# Patient Record
Sex: Female | Born: 1990 | Race: Black or African American | Hispanic: No | Marital: Married | Smoking: Never smoker
Health system: Southern US, Community
[De-identification: ages and names within clinical notes are randomized; demographics above are authoritative.]

## PROBLEM LIST (undated history)

## (undated) DIAGNOSIS — R7303 Prediabetes: Secondary | ICD-10-CM

## (undated) DIAGNOSIS — E230 Hypopituitarism: Secondary | ICD-10-CM

## (undated) DIAGNOSIS — N970 Female infertility associated with anovulation: Secondary | ICD-10-CM

## (undated) DIAGNOSIS — E236 Other disorders of pituitary gland: Secondary | ICD-10-CM

## (undated) HISTORY — PX: NO PAST SURGERIES: SHX2092

## (undated) HISTORY — DX: Hypopituitarism: E23.0

## (undated) HISTORY — DX: Prediabetes: R73.03

## (undated) HISTORY — DX: Other disorders of pituitary gland: E23.6

---

## 2018-03-29 ENCOUNTER — Other Ambulatory Visit: Payer: Self-pay | Admitting: Obstetrics & Gynecology

## 2018-03-29 DIAGNOSIS — N970 Female infertility associated with anovulation: Secondary | ICD-10-CM

## 2018-04-08 ENCOUNTER — Other Ambulatory Visit: Payer: Self-pay

## 2018-04-15 ENCOUNTER — Ambulatory Visit
Admission: RE | Admit: 2018-04-15 | Discharge: 2018-04-15 | Disposition: A | Discharge status: Home / Self Care | Payer: BLUE CROSS/BLUE SHIELD | Source: Ambulatory Visit | Attending: Obstetrics & Gynecology | Admitting: Obstetrics & Gynecology

## 2018-04-15 DIAGNOSIS — N970 Female infertility associated with anovulation: Secondary | ICD-10-CM

## 2019-04-19 DIAGNOSIS — R7989 Other specified abnormal findings of blood chemistry: Secondary | ICD-10-CM | POA: Insufficient documentation

## 2019-11-13 IMAGING — RF HYSTEROSALPINGOGRAM
1 series · 7 of 7 positions shown · IV contrast (omnipaque)
Comparison: None.

CLINICAL DATA: Female infertility associated with anovulation.
Amenorrhea.

EXAM:
HYSTEROSALPINGOGRAM
TECHNIQUE: Following cleansing of the cervix and vagina with Betadine solution,
a hysterosalpingogram was performed using a 5-French
hysterosalpingogram catheter and Omnipaque 300 contrast. The patient
tolerated the examination without difficulty.

[Series 1: one shot · 0.15mm/px · 7 of 7 slices shown]
[im 1/7]
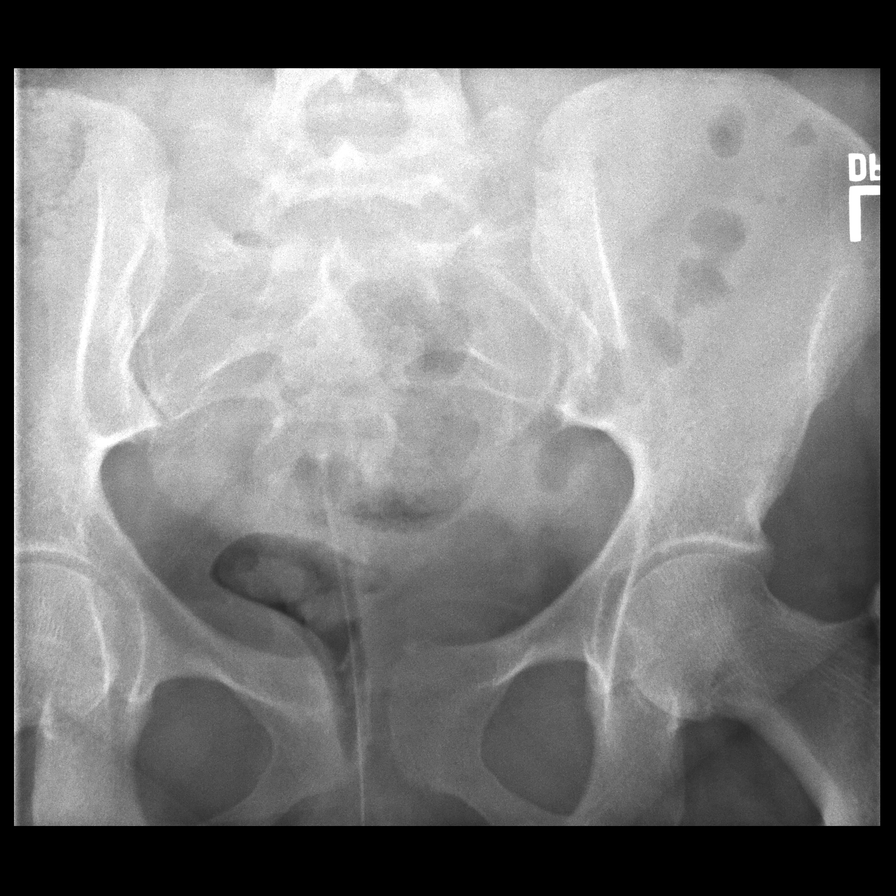
[im 2/7]
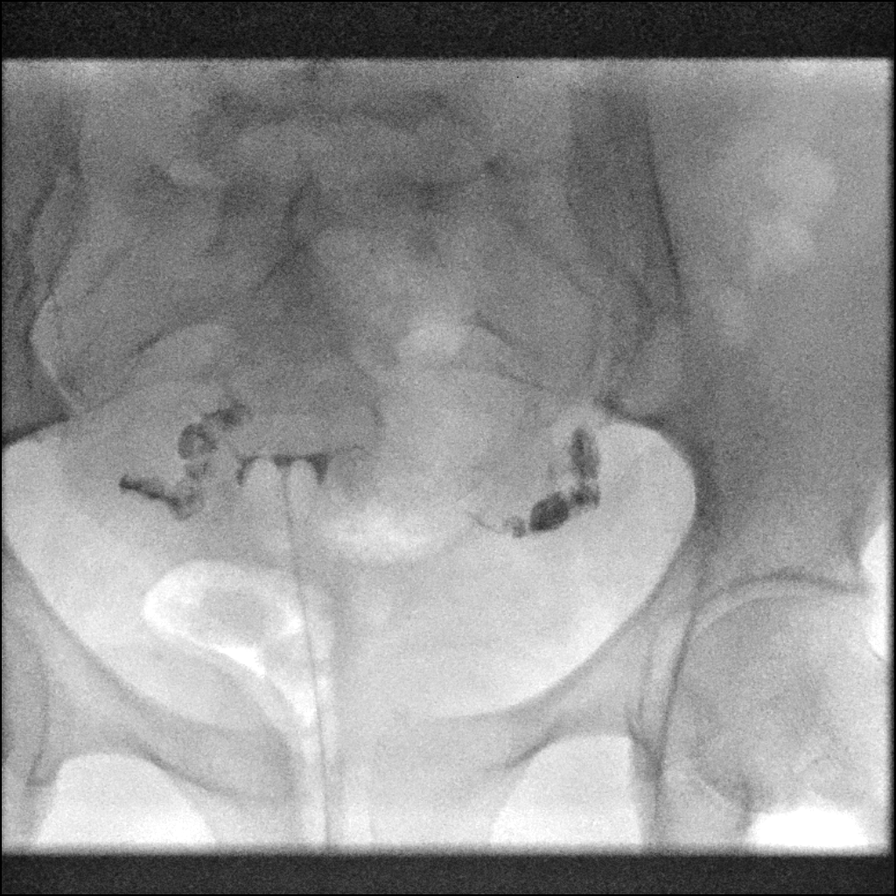
[im 3/7]
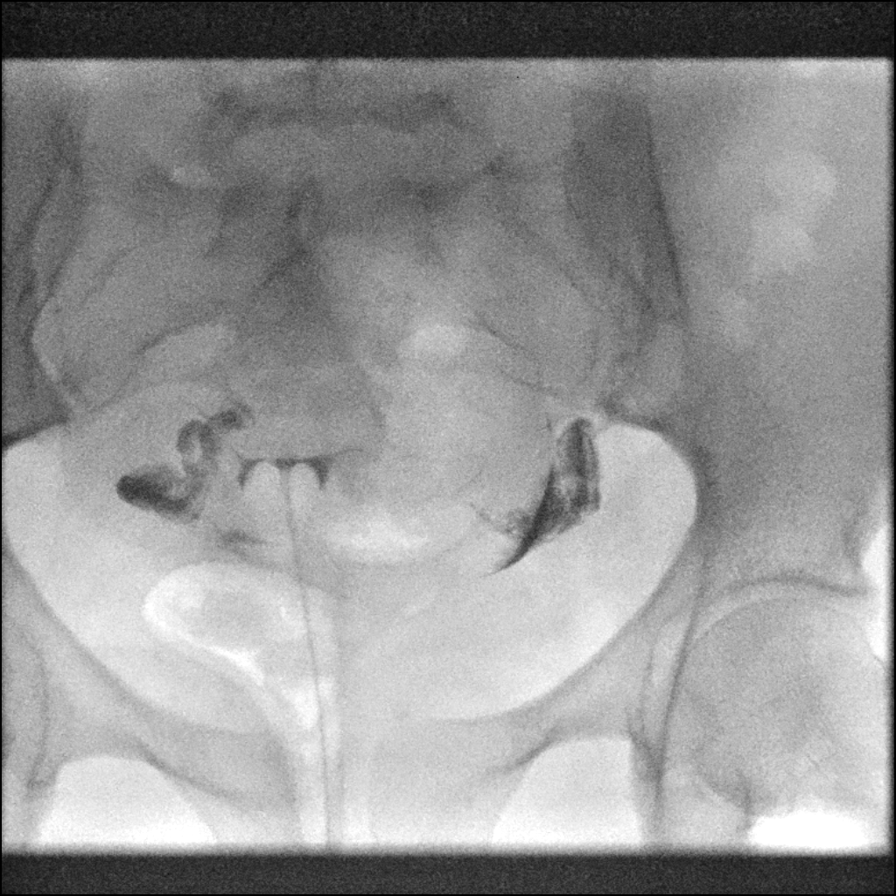
[im 4/7]
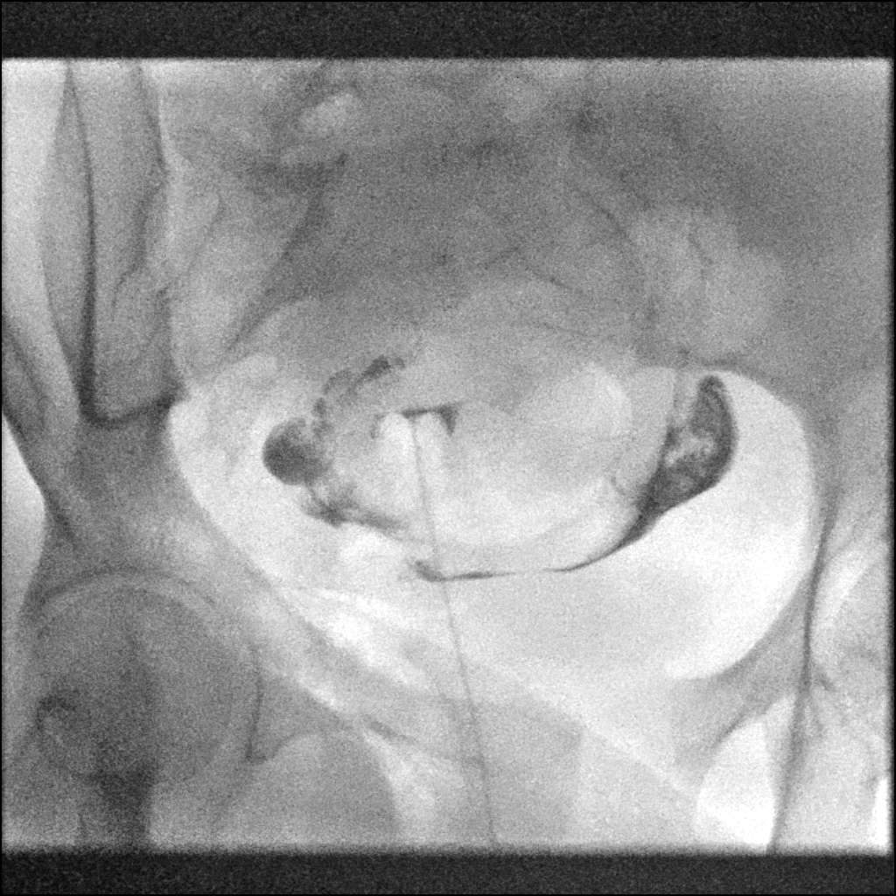
[im 5/7]
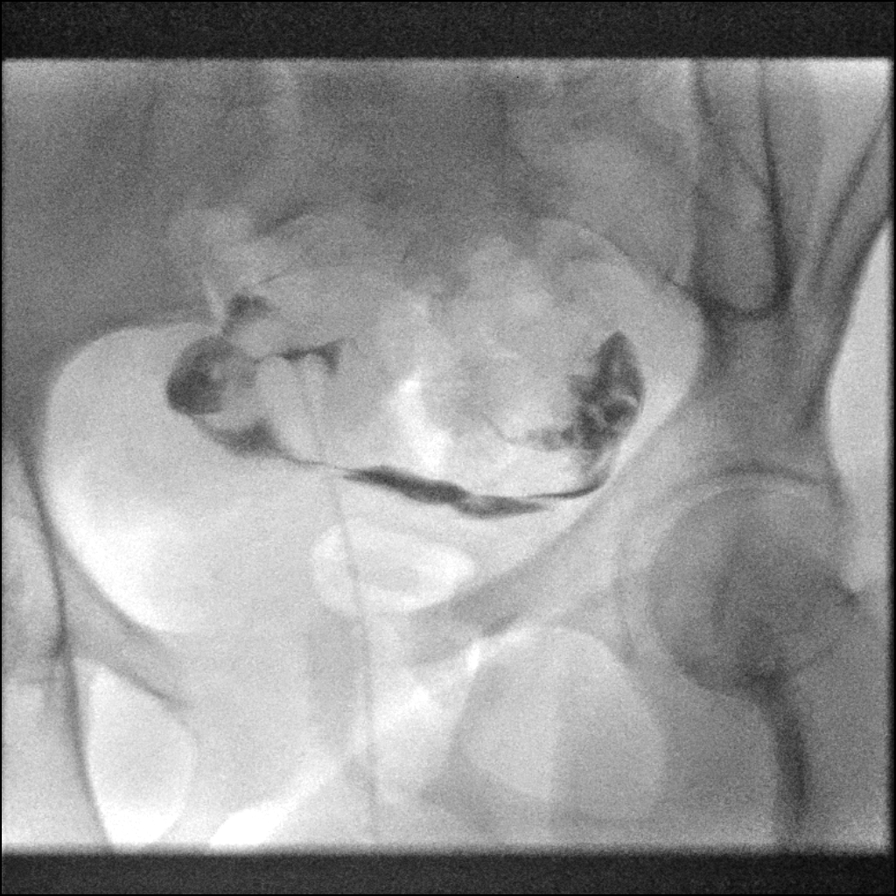
[im 6/7]
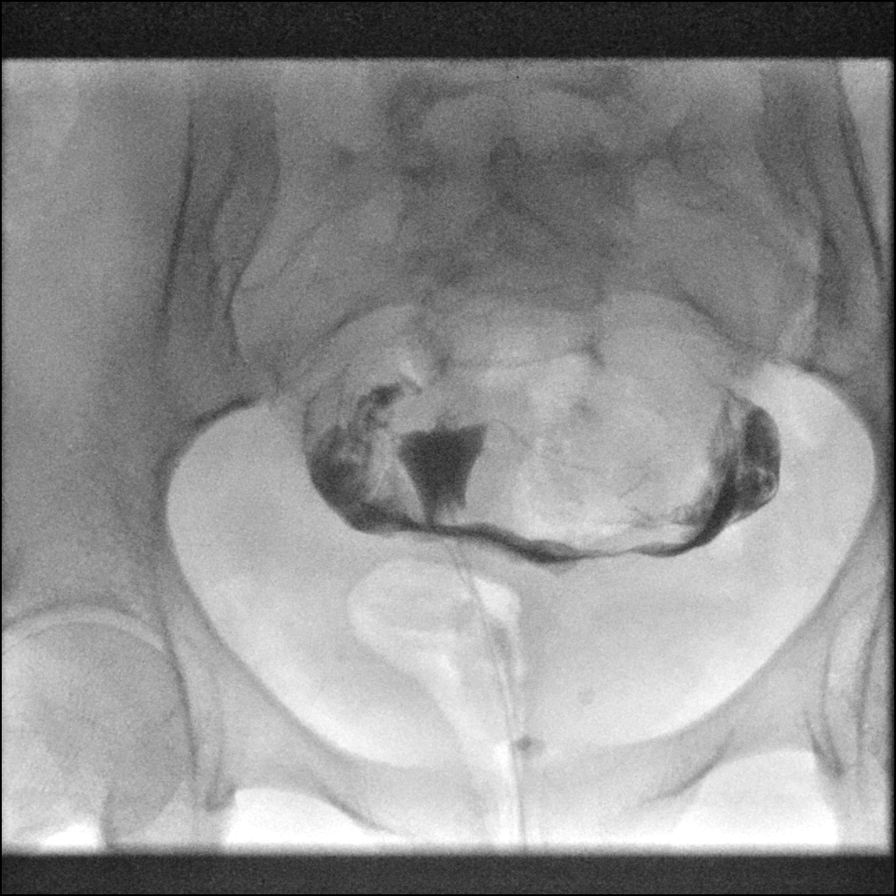
[im 7/7]
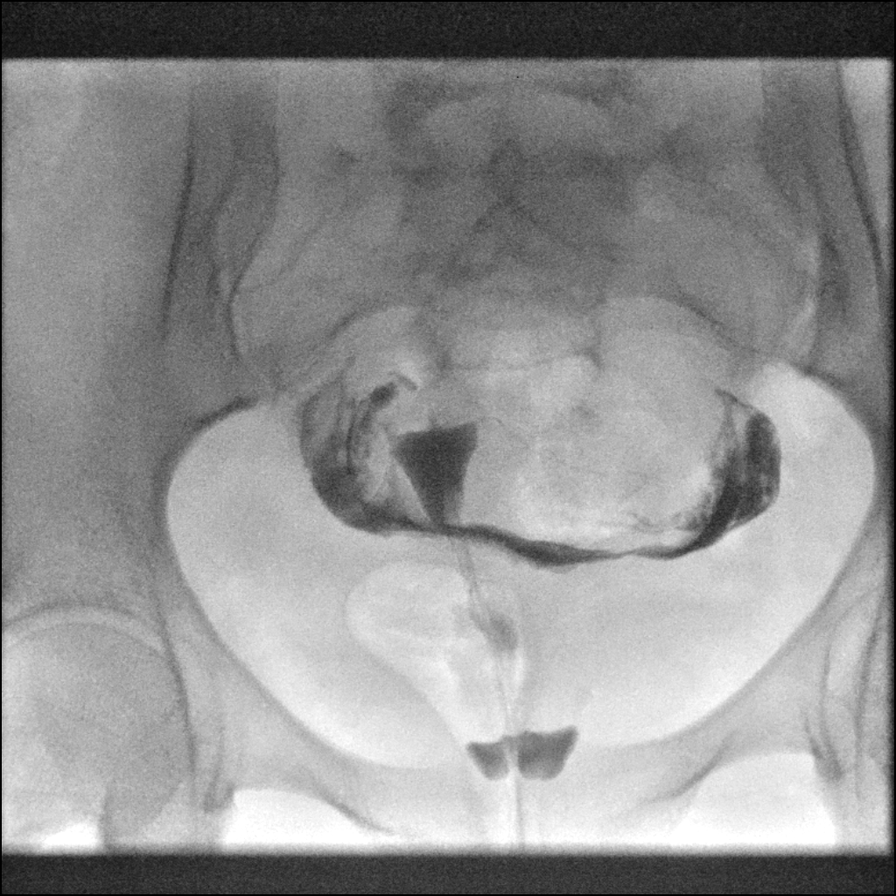

[7 of 7 positions shown; findings below may reference images not displayed]

FLUOROSCOPY TIME:  Radiation Exposure Index (as provided by the
fluoroscopic device): 55.1 mGy

Number of Acquired Images:  1
FINDINGS: Endometrial Cavity: Normal appearance. No signs of Mullerian duct
anomaly or other significant abnormality.

Right Fallopian Tube: Normal appearance. Free intraperitoneal spill
of contrast is demonstrated.

Left Fallopian Tube: Normal appearance. Free intraperitoneal spill
of contrast is demonstrated.

Other:  None.
IMPRESSION: Normal study. Both Fallopian tubes are patent.

## 2020-07-27 DIAGNOSIS — R638 Other symptoms and signs concerning food and fluid intake: Secondary | ICD-10-CM | POA: Insufficient documentation

## 2020-07-27 DIAGNOSIS — E237 Disorder of pituitary gland, unspecified: Secondary | ICD-10-CM | POA: Insufficient documentation

## 2022-01-31 DIAGNOSIS — O0903 Supervision of pregnancy with history of infertility, third trimester: Secondary | ICD-10-CM | POA: Insufficient documentation

## 2022-03-16 ENCOUNTER — Other Ambulatory Visit: Payer: Self-pay | Admitting: Obstetrics

## 2022-03-16 ENCOUNTER — Telehealth: Payer: Self-pay | Admitting: Obstetrics

## 2022-03-16 DIAGNOSIS — E236 Other disorders of pituitary gland: Secondary | ICD-10-CM

## 2022-04-06 ENCOUNTER — Encounter: Payer: Self-pay | Admitting: *Deleted

## 2022-04-10 ENCOUNTER — Ambulatory Visit: Payer: 59 | Attending: Obstetrics

## 2022-04-10 ENCOUNTER — Ambulatory Visit: Payer: 59 | Admitting: *Deleted

## 2022-04-10 ENCOUNTER — Encounter: Payer: Self-pay | Admitting: *Deleted

## 2022-04-10 ENCOUNTER — Ambulatory Visit: Payer: BLUE CROSS/BLUE SHIELD | Attending: Obstetrics and Gynecology | Admitting: Obstetrics and Gynecology

## 2022-04-10 DIAGNOSIS — E669 Obesity, unspecified: Secondary | ICD-10-CM

## 2022-04-10 DIAGNOSIS — Z3A19 19 weeks gestation of pregnancy: Secondary | ICD-10-CM | POA: Diagnosis not present

## 2022-04-10 DIAGNOSIS — O99212 Obesity complicating pregnancy, second trimester: Secondary | ICD-10-CM

## 2022-04-10 DIAGNOSIS — O99810 Abnormal glucose complicating pregnancy: Secondary | ICD-10-CM

## 2022-04-10 DIAGNOSIS — O99282 Endocrine, nutritional and metabolic diseases complicating pregnancy, second trimester: Secondary | ICD-10-CM | POA: Diagnosis not present

## 2022-04-10 DIAGNOSIS — Z8639 Personal history of other endocrine, nutritional and metabolic disease: Secondary | ICD-10-CM

## 2022-04-10 DIAGNOSIS — E668 Other obesity: Secondary | ICD-10-CM | POA: Diagnosis not present

## 2022-04-10 DIAGNOSIS — E236 Other disorders of pituitary gland: Secondary | ICD-10-CM | POA: Insufficient documentation

## 2022-04-10 DIAGNOSIS — O35EXX0 Maternal care for other (suspected) fetal abnormality and damage, fetal genitourinary anomalies, not applicable or unspecified: Secondary | ICD-10-CM | POA: Diagnosis not present

## 2022-04-10 NOTE — Progress Notes (Signed)
Maternal-Fetal Medicine   Name: Carolyn Salinas DOB: [birth year] MRN: [id number] Referring Provider: Avelino Leeds, CNM  I had the pleasure of seeing [name] today at the [facility]. She is G1 P0 at 19w 5d gestation and is here for fetal anatomy scan.  Patient conceived after ovulation inducing drugs.  She has not had screening for fetal aneuploidies. Past medical history significant for congenital Rathke's cyst (pituitary).  Patient reports that the cyst was small and does not cause any endocrine problems.    She does not have hypertension or diabetes or any other chronic medical conditions. Hemoglobin A1c was 5.9%.  Early screening ruled out gestational diabetes.  We performed a fetal anatomical survey.  Amniotic fluid is normal and good fetal activity is seen.  Fetal growth is appropriate for gestational age.  Left kidney appears normal.  Right kidney could not be visualized.  No clear evidence of pelvic kidney. Fetal sex is female. Intracranial anatomy, spine and rest of the anatomy appears normal.  No other obvious structural anomalies are seen.   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  In the absence of associated anomalies, chromosomal anomalies are rare. However, it can be associated with some chromosomal anomalies (trisomy 21, 10, 7, 22q 11.2 deletion) and genetic syndromes. I offered amniocentesis for fetal chromosome analysis and microarray. Non-invasive prenatal screening detects trisomies 50, 18 and 13 with greater accuracy, but is not diagnostic.    A detailed evaluation showed that no other anomalies are present and unilateral agenesis appears to be isolated. I reassured that in the absence of other anomalies, isolated unilateral renal agenesis is associated with good postnatal outcomes. Patient understands the limitations of  ultrasound in detecting fetal anomalies.   Pelvic kidney may be missed in some cases and may be evident only at postnatal evaluation. Pelvic kidneys are identified later in gestation in many cases.  After counseling, the patient opted not to have amniocentesis. She will consider cell-free fetal DNA screening at your office.  I recommended fetal echocardiography.    Recommendations -An appointment was made for her to return in 4 weeks for fetal growth assessment. -Postnatal evaluation to confirm unilateral renal agenesis. -We have requested an appointment for fetal echocardiography (pediatric cardiology, [facility]).  Thank you for consultation.  If you have any questions or concerns, please contact me the [facility].  Consultation including face-to-face (more than 50%) counseling 30 minutes.

## 2022-04-11 ENCOUNTER — Other Ambulatory Visit: Payer: Self-pay | Admitting: *Deleted

## 2022-04-11 DIAGNOSIS — O35EXX0 Maternal care for other (suspected) fetal abnormality and damage, fetal genitourinary anomalies, not applicable or unspecified: Secondary | ICD-10-CM

## 2022-05-11 ENCOUNTER — Other Ambulatory Visit: Payer: Self-pay | Admitting: *Deleted

## 2022-05-11 ENCOUNTER — Ambulatory Visit: Payer: 59 | Admitting: *Deleted

## 2022-05-11 ENCOUNTER — Ambulatory Visit: Payer: 59 | Attending: Obstetrics and Gynecology

## 2022-05-11 ENCOUNTER — Encounter: Payer: Self-pay | Admitting: *Deleted

## 2022-05-11 VITALS — BP 121/70 | HR 101

## 2022-05-11 DIAGNOSIS — Z362 Encounter for other antenatal screening follow-up: Secondary | ICD-10-CM | POA: Diagnosis present

## 2022-05-11 DIAGNOSIS — O99810 Abnormal glucose complicating pregnancy: Secondary | ICD-10-CM | POA: Diagnosis not present

## 2022-05-11 DIAGNOSIS — O35EXX0 Maternal care for other (suspected) fetal abnormality and damage, fetal genitourinary anomalies, not applicable or unspecified: Secondary | ICD-10-CM | POA: Insufficient documentation

## 2022-05-11 DIAGNOSIS — O99282 Endocrine, nutritional and metabolic diseases complicating pregnancy, second trimester: Secondary | ICD-10-CM

## 2022-05-11 DIAGNOSIS — E230 Hypopituitarism: Secondary | ICD-10-CM | POA: Diagnosis not present

## 2022-05-11 DIAGNOSIS — O99212 Obesity complicating pregnancy, second trimester: Secondary | ICD-10-CM

## 2022-05-11 DIAGNOSIS — E669 Obesity, unspecified: Secondary | ICD-10-CM

## 2022-05-11 DIAGNOSIS — Z3A24 24 weeks gestation of pregnancy: Secondary | ICD-10-CM

## 2022-06-08 ENCOUNTER — Ambulatory Visit: Payer: 59 | Attending: Obstetrics and Gynecology

## 2022-06-08 ENCOUNTER — Ambulatory Visit: Payer: 59 | Admitting: *Deleted

## 2022-06-08 VITALS — BP 124/72 | HR 113

## 2022-06-08 DIAGNOSIS — O99213 Obesity complicating pregnancy, third trimester: Secondary | ICD-10-CM | POA: Diagnosis present

## 2022-06-08 DIAGNOSIS — E669 Obesity, unspecified: Secondary | ICD-10-CM

## 2022-06-08 DIAGNOSIS — O99810 Abnormal glucose complicating pregnancy: Secondary | ICD-10-CM

## 2022-06-08 DIAGNOSIS — O35EXX0 Maternal care for other (suspected) fetal abnormality and damage, fetal genitourinary anomalies, not applicable or unspecified: Secondary | ICD-10-CM

## 2022-06-08 DIAGNOSIS — Z3A28 28 weeks gestation of pregnancy: Secondary | ICD-10-CM

## 2022-06-08 DIAGNOSIS — E230 Hypopituitarism: Secondary | ICD-10-CM | POA: Diagnosis not present

## 2022-06-08 DIAGNOSIS — O99212 Obesity complicating pregnancy, second trimester: Secondary | ICD-10-CM | POA: Diagnosis present

## 2022-06-08 DIAGNOSIS — O99283 Endocrine, nutritional and metabolic diseases complicating pregnancy, third trimester: Secondary | ICD-10-CM | POA: Diagnosis not present

## 2022-06-09 ENCOUNTER — Other Ambulatory Visit: Payer: Self-pay | Admitting: *Deleted

## 2022-06-09 DIAGNOSIS — Z362 Encounter for other antenatal screening follow-up: Secondary | ICD-10-CM

## 2022-06-09 DIAGNOSIS — N151 Renal and perinephric abscess: Secondary | ICD-10-CM

## 2022-07-14 ENCOUNTER — Ambulatory Visit: Payer: 59 | Admitting: *Deleted

## 2022-07-14 ENCOUNTER — Ambulatory Visit: Payer: 59 | Attending: Obstetrics

## 2022-07-14 VITALS — BP 112/81 | HR 131

## 2022-07-14 DIAGNOSIS — N151 Renal and perinephric abscess: Secondary | ICD-10-CM | POA: Insufficient documentation

## 2022-07-14 DIAGNOSIS — O99213 Obesity complicating pregnancy, third trimester: Secondary | ICD-10-CM | POA: Insufficient documentation

## 2022-07-14 DIAGNOSIS — O35EXX0 Maternal care for other (suspected) fetal abnormality and damage, fetal genitourinary anomalies, not applicable or unspecified: Secondary | ICD-10-CM | POA: Diagnosis not present

## 2022-07-14 DIAGNOSIS — E669 Obesity, unspecified: Secondary | ICD-10-CM

## 2022-07-14 DIAGNOSIS — Z362 Encounter for other antenatal screening follow-up: Secondary | ICD-10-CM | POA: Insufficient documentation

## 2022-07-14 DIAGNOSIS — Z3A33 33 weeks gestation of pregnancy: Secondary | ICD-10-CM | POA: Diagnosis not present

## 2022-07-14 DIAGNOSIS — E237 Disorder of pituitary gland, unspecified: Secondary | ICD-10-CM | POA: Diagnosis present

## 2022-07-17 ENCOUNTER — Other Ambulatory Visit: Payer: Self-pay | Admitting: *Deleted

## 2022-07-17 DIAGNOSIS — O35EXX0 Maternal care for other (suspected) fetal abnormality and damage, fetal genitourinary anomalies, not applicable or unspecified: Secondary | ICD-10-CM

## 2022-08-15 ENCOUNTER — Ambulatory Visit: Payer: 59

## 2022-08-28 ENCOUNTER — Other Ambulatory Visit: Payer: Self-pay | Admitting: Certified Nurse Midwife

## 2022-08-28 DIAGNOSIS — Z3490 Encounter for supervision of normal pregnancy, unspecified, unspecified trimester: Secondary | ICD-10-CM

## 2022-08-28 NOTE — Progress Notes (Signed)
G1P0 at [redacted]w[redacted]d, LMP of 12/04/21, c/w early [place] at [redacted]w[redacted]d.  Scheduled for Post dates. Due date 08/29/22 and IVF induction of labor on 08/30/22.   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

## 2022-08-30 ENCOUNTER — Inpatient Hospital Stay
Admission: RE | Admit: 2022-08-30 | Discharge: 2022-09-02 | DRG: 786 | Disposition: A | Discharge status: Home / Self Care | Payer: Managed Care, Other (non HMO) | Source: Ambulatory Visit | Attending: Certified Nurse Midwife | Admitting: Certified Nurse Midwife

## 2022-08-30 ENCOUNTER — Inpatient Hospital Stay: Payer: Managed Care, Other (non HMO) | Admitting: Anesthesiology

## 2022-08-30 ENCOUNTER — Other Ambulatory Visit: Payer: Self-pay

## 2022-08-30 ENCOUNTER — Encounter: Payer: Self-pay | Admitting: Obstetrics and Gynecology

## 2022-08-30 DIAGNOSIS — O358XX0 Maternal care for other (suspected) fetal abnormality and damage, not applicable or unspecified: Secondary | ICD-10-CM | POA: Diagnosis present

## 2022-08-30 DIAGNOSIS — Z3490 Encounter for supervision of normal pregnancy, unspecified, unspecified trimester: Principal | ICD-10-CM | POA: Diagnosis present

## 2022-08-30 DIAGNOSIS — Z7982 Long term (current) use of aspirin: Secondary | ICD-10-CM | POA: Diagnosis not present

## 2022-08-30 DIAGNOSIS — O0903 Supervision of pregnancy with history of infertility, third trimester: Secondary | ICD-10-CM

## 2022-08-30 DIAGNOSIS — O3663X0 Maternal care for excessive fetal growth, third trimester, not applicable or unspecified: Principal | ICD-10-CM | POA: Diagnosis present

## 2022-08-30 DIAGNOSIS — D62 Acute posthemorrhagic anemia: Secondary | ICD-10-CM | POA: Diagnosis not present

## 2022-08-30 DIAGNOSIS — F419 Anxiety disorder, unspecified: Secondary | ICD-10-CM | POA: Diagnosis present

## 2022-08-30 DIAGNOSIS — Z3A40 40 weeks gestation of pregnancy: Secondary | ICD-10-CM | POA: Diagnosis not present

## 2022-08-30 DIAGNOSIS — O411230 Chorioamnionitis, third trimester, not applicable or unspecified: Secondary | ICD-10-CM | POA: Diagnosis present

## 2022-08-30 DIAGNOSIS — Z8249 Family history of ischemic heart disease and other diseases of the circulatory system: Secondary | ICD-10-CM

## 2022-08-30 DIAGNOSIS — O9081 Anemia of the puerperium: Secondary | ICD-10-CM | POA: Diagnosis not present

## 2022-08-30 DIAGNOSIS — O99214 Obesity complicating childbirth: Secondary | ICD-10-CM | POA: Diagnosis present

## 2022-08-30 DIAGNOSIS — R7303 Prediabetes: Secondary | ICD-10-CM | POA: Diagnosis present

## 2022-08-30 DIAGNOSIS — O26893 Other specified pregnancy related conditions, third trimester: Secondary | ICD-10-CM | POA: Diagnosis present

## 2022-08-30 HISTORY — DX: Female infertility associated with anovulation: N97.0

## 2022-08-30 LAB — TYPE AND SCREEN
ABO/RH(D): B POS
Antibody Screen: NEGATIVE

## 2022-08-30 LAB — CBC
HCT: 32.3 % — ABNORMAL LOW (ref 36.0–46.0)
Hemoglobin: 11 g/dL — ABNORMAL LOW (ref 12.0–15.0)
MCH: 27.9 pg (ref 26.0–34.0)
MCHC: 34.1 g/dL (ref 30.0–36.0)
MCV: 82 fL (ref 80.0–100.0)
Platelets: 280 10*3/uL (ref 150–400)
RBC: 3.94 MIL/uL (ref 3.87–5.11)
RDW: 18.5 % — ABNORMAL HIGH (ref 11.5–15.5)
WBC: 10.8 10*3/uL — ABNORMAL HIGH (ref 4.0–10.5)
nRBC: 0 % (ref 0.0–0.2)

## 2022-08-30 LAB — ABO/RH: ABO/RH(D): B POS

## 2022-08-30 LAB — OB RESULTS CONSOLE RPR: RPR: NONREACTIVE

## 2022-08-30 MED ORDER — MISOPROSTOL 50MCG HALF TABLET
ORAL_TABLET | ORAL | Status: AC
  Administered 2022-08-30: 50 ug
  Filled 2022-08-30: qty 1

## 2022-08-30 MED ORDER — FENTANYL-BUPIVACAINE-NACL 0.5-0.125-0.9 MG/250ML-% EP SOLN
EPIDURAL | Status: AC
  Filled 2022-08-30: qty 250

## 2022-08-30 MED ORDER — SOD CITRATE-CITRIC ACID 500-334 MG/5ML PO SOLN: 30.0000 mL | ORAL | Status: DC | PRN

## 2022-08-30 MED ORDER — ACETAMINOPHEN 325 MG PO TABS: 650.0000 mg | ORAL_TABLET | ORAL | Status: DC | PRN

## 2022-08-30 MED ORDER — LIDOCAINE-EPINEPHRINE (PF) 1.5 %-1:200000 IJ SOLN
INTRAMUSCULAR | Status: DC | PRN
  Administered 2022-08-30: 3 mL via PERINEURAL

## 2022-08-30 MED ORDER — PHENYLEPHRINE 80 MCG/ML (10ML) SYRINGE FOR IV PUSH (FOR BLOOD PRESSURE SUPPORT): 80.0000 ug | PREFILLED_SYRINGE | INTRAVENOUS | Status: DC | PRN

## 2022-08-30 MED ORDER — LACTATED RINGERS IV SOLN: 500.0000 mL | Freq: Once | INTRAVENOUS | Status: DC

## 2022-08-30 MED ORDER — SODIUM CHLORIDE 0.9 % IV SOLN
INTRAVENOUS | Status: DC | PRN
  Administered 2022-08-30 (×2): 5 mL via EPIDURAL

## 2022-08-30 MED ORDER — LIDOCAINE HCL (PF) 1 % IJ SOLN
INTRAMUSCULAR | Status: DC | PRN
  Administered 2022-08-30: 2 mL via SUBCUTANEOUS
  Administered 2022-08-30: 3 mL via SUBCUTANEOUS

## 2022-08-30 MED ORDER — MISOPROSTOL 50MCG HALF TABLET
50.0000 ug | ORAL_TABLET | Freq: Once | ORAL | Status: DC
  Filled 2022-08-30: qty 1

## 2022-08-30 MED ORDER — OXYTOCIN-SODIUM CHLORIDE 30-0.9 UT/500ML-% IV SOLN
1.0000 m[IU]/min | INTRAVENOUS | Status: DC
  Filled 2022-08-30: qty 500

## 2022-08-30 MED ORDER — FENTANYL-BUPIVACAINE-NACL 0.5-0.125-0.9 MG/250ML-% EP SOLN
EPIDURAL | Status: DC | PRN
  Administered 2022-08-30: 12 mL/h via EPIDURAL

## 2022-08-30 MED ORDER — TERBUTALINE SULFATE 1 MG/ML IJ SOLN: 0.2500 mg | Freq: Once | INTRAMUSCULAR | Status: DC | PRN

## 2022-08-30 MED ORDER — FENTANYL-BUPIVACAINE-NACL 0.5-0.125-0.9 MG/250ML-% EP SOLN: 12.0000 mL/h | EPIDURAL | Status: DC | PRN

## 2022-08-30 MED ORDER — LACTATED RINGERS IV SOLN: 500.0000 mL | INTRAVENOUS | Status: DC | PRN

## 2022-08-30 MED ORDER — EPHEDRINE 5 MG/ML INJ: 10.0000 mg | INTRAVENOUS | Status: DC | PRN

## 2022-08-30 MED ORDER — OXYTOCIN-SODIUM CHLORIDE 30-0.9 UT/500ML-% IV SOLN
1.0000 m[IU]/min | INTRAVENOUS | Status: DC
  Administered 2022-08-30: 2 m[IU]/min via INTRAVENOUS

## 2022-08-30 MED ORDER — OXYTOCIN-SODIUM CHLORIDE 30-0.9 UT/500ML-% IV SOLN
2.5000 [IU]/h | INTRAVENOUS | Status: DC
  Administered 2022-08-31: 600 m[IU]/h via INTRAVENOUS

## 2022-08-30 MED ORDER — LIDOCAINE HCL (PF) 1 % IJ SOLN: 30.0000 mL | INTRAMUSCULAR | Status: DC | PRN

## 2022-08-30 MED ORDER — MISOPROSTOL 50MCG HALF TABLET
50.0000 ug | ORAL_TABLET | Freq: Once | ORAL | Status: AC
  Administered 2022-08-30: 50 ug via ORAL

## 2022-08-30 MED ORDER — OXYTOCIN BOLUS FROM INFUSION: 333.0000 mL | Freq: Once | INTRAVENOUS | Status: DC

## 2022-08-30 MED ORDER — FENTANYL CITRATE (PF) 100 MCG/2ML IJ SOLN
50.0000 ug | INTRAMUSCULAR | Status: DC | PRN
  Administered 2022-08-30: 50 ug via INTRAVENOUS
  Administered 2022-08-30: 100 ug via INTRAVENOUS
  Filled 2022-08-30 (×3): qty 2

## 2022-08-30 MED ORDER — DIPHENHYDRAMINE HCL 50 MG/ML IJ SOLN: 12.5000 mg | INTRAMUSCULAR | Status: DC | PRN

## 2022-08-30 MED ORDER — LACTATED RINGERS IV SOLN: INTRAVENOUS | Status: DC

## 2022-08-30 MED ORDER — ONDANSETRON HCL 4 MG/2ML IJ SOLN
4.0000 mg | Freq: Four times a day (QID) | INTRAMUSCULAR | Status: DC | PRN
  Administered 2022-08-30: 4 mg via INTRAVENOUS
  Filled 2022-08-30: qty 2

## 2022-08-30 NOTE — H&P (Signed)
OB History & Physical   History of Present Illness:   Chief Complaint: IOL  HPI:  Dalya Maselli is a 32 y.o. G1P0 female at [redacted]w[redacted]d, Patient's last menstrual period was 11/19/2021 (approximate)., consistent with [place] at [redacted]w[redacted]d, with Estimated Date of Delivery: 08/30/22.  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 Obesity Anxiety and depression Fetal macrosomia Anemia Prediabetes Unilateral renal agenesis  Left fetal renal pelviectasis= 0.88 mm  Patient Active Problem List   Diagnosis Date Noted   Fetal renal anomaly, unspecified fetus (unilateral renal agenesis; absent right  kidney) 07/14/2022   Increased BMI 07/27/2020   Pituitary disorder - rathke cleft cyst 07/27/2020   Low serum cortisol level 04/19/2019    Prenatal Transfer Tool  Maternal Diabetes: No Genetic Screening: Declined Maternal Ultrasounds/Referrals: Fetal Kidney Anomalies and Fetal renal pyelectasis Fetal Ultrasounds or other Referrals:  None Maternal Substance Abuse:  No Significant Maternal Medications:  None Significant Maternal Lab Results: Group B Strep negative  Maternal Medical History:   Past Medical History:  Diagnosis Date   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 Authorizing Provider  aspirin EC 81 MG tablet Take 81 mg by mouth daily. Swallow whole.    [provider]  Prenatal Vit-Fe Fumarate-FA (PRENATAL MULTIVITAMIN) TABS tablet Take 1 tablet by mouth daily at 12 noon.    [provider]     Prenatal care site:  [facility]  OB History  Gravida Para Term Preterm AB Living  1 0 0 0 0 0   SAB IAB Ectopic Multiple Live Births  0 0 0 0 0    # Outcome Date GA Lbr Len/2nd Weight Sex Type Anes PTL Lv  1 Current              Social History: She  reports that she has never smoked. She has never used smokeless tobacco. She reports that she does not currently use alcohol. She reports that she does not use drugs.  Family History: family history includes Hypertension in her father; Ovarian cancer in her maternal grandfather.   Review of Systems: A full review of systems was performed and negative except as noted in the HPI.     Physical Exam:  Vital Signs: LMP 11/19/2021 (Approximate)   General: no acute distress.  HEENT: normocephalic, atraumatic Heart: regular rate & rhythm Lungs: normal respiratory effort Abdomen: soft, gravid, non-tender;  EFW: 7.13 lbs Pelvic:   External: Normal external female genitalia  Cervix:   /   /      Extremities: non-tender, symmetric,  edema bilaterally.  DTRs: +2  Neurologic: Alert & oriented x 3.    No results found for this or any previous visit (from the past 24 hour(s)).  Pertinent Results:  Prenatal Labs:  Blood type/Rh B Pos  Antibody screen neg  Rubella immune    Varicella Immune  RPR NR  HBsAg   Neg  Hep C NR  HIV   NR  GC neg  Chlamydia neg  Genetic screening cfDNA declined  1 hour GTT 157  3 hour GTT 94, 204, 141, 112  GBS   Neg   FHT:  FHR: 155 bpm, variability: moderate,  accelerations:  Present,  decelerations:  Absent Category/reactivity:  Category I UC:   none   Cephalic by Leopolds and SVE   No results found.  Assessment:  Onesti Bonfiglio is a 32 y.o. G1P0 female at [redacted]w[redacted]d with IVF pregnancy.   Plan:  1. Admit to Labor & Delivery - consents reviewed and obtained - Dr. Jean Rosenthal notified of admission and plan of care   2. Fetal Well being  - Fetal Tracing: category 1 - Group B Streptococcus ppx not indicated: GBS negative - Presentation: cephalic confirmed by SVE   3. Routine OB: - Prenatal  labs reviewed, as above - Rh positive - CBC, T&S, RPR on admit - Clear liquid diet , continuous IV fluids  4. Induction of labor  - Contractions monitored with external toco - Pelvis  adequate for trial of labor  - Plan for induction with misoprostol and cervical balloon  - Augmentation with oxytocin and AROM as appropriate  - Plan for  continuous fetal monitoring - Maternal pain control as desired; planning regional anesthesia, IVPM, and unmedicated labor support options  - Anticipate vaginal delivery  5. Post Partum Planning: - Infant feeding: breast feeding - Contraception: no method - Tdap vaccine: Given prenatally - Flu vaccine:  not in season  Stokes Rattigan LUCY Delmar Landau, CNM 08/30/22 8:42 AM  Chari Manning, CNM Certified Nurse Midwife [facility]

## 2022-08-30 NOTE — Anesthesia Preprocedure Evaluation (Signed)
Anesthesia Evaluation  Patient identified by MRN, date of birth, ID band Patient awake    Reviewed: Allergy & Precautions, H&P , NPO status , Patient's Chart, lab work & pertinent test results, reviewed documented beta blocker date and time   History of Anesthesia Complications Negative for: history of anesthetic complications  Airway Mallampati: III  TM Distance: >3 FB Neck ROM: full    Dental no notable dental hx.    Pulmonary neg pulmonary ROS, Continuous Positive Airway Pressure Ventilation    Pulmonary exam normal breath sounds clear to auscultation       Cardiovascular Exercise Tolerance: Good negative cardio ROS Normal cardiovascular exam Rhythm:regular Rate:Normal     Neuro/Psych negative neurological ROS  negative psych ROS   GI/Hepatic Neg liver ROS,GERD  ,,  Endo/Other  diabetes, Gestational    Renal/GU negative Renal ROS  negative genitourinary   Musculoskeletal   Abdominal   Peds  Hematology negative hematology ROS (+)   Anesthesia Other Findings Past Medical History: No date: Infertility associated with anovulation No date: 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) No date: Prediabetes   Reproductive/Obstetrics (+) Pregnancy                             Anesthesia Physical Anesthesia Plan  ASA: 2  Anesthesia Plan: Epidural   Post-op Pain Management:    Induction:   PONV Risk Score and Plan:   Airway Management Planned:   Additional Equipment:   Intra-op Plan:   Post-operative Plan:   Informed Consent: I have reviewed the patients History and Physical, chart, labs and discussed the procedure including the risks, benefits and alternatives for the proposed anesthesia with the patient or authorized representative who has indicated his/her understanding and acceptance.     Dental Advisory Given  Plan Discussed with: Anesthesiologist, CRNA and  Surgeon  Anesthesia Plan Comments: (Patient consented for backup general anesthesia in case the epidural fails.)       Anesthesia Quick Evaluation

## 2022-08-30 NOTE — Anesthesia Procedure Notes (Signed)
Epidural Patient location during procedure: OB Start time: 08/30/2022 8:07 PM End time: 08/30/2022 8:28 PM  Staffing Anesthesiologist: Lenard Simmer, MD Performed: anesthesiologist   Preanesthetic Checklist Completed: patient identified, IV checked, site marked, risks and benefits discussed, surgical consent, monitors and equipment checked, pre-op evaluation and timeout performed  Epidural Patient position: sitting Prep: ChloraPrep Patient monitoring: heart rate, continuous pulse ox and blood pressure Approach: midline Location: L2-L3 Injection technique: LOR saline  Needle:  Needle type: Tuohy  Needle gauge: 17 G Needle length: 9 cm Needle insertion depth: 6 cm Catheter type: closed end flexible Catheter size: 19 Gauge Catheter at skin depth: 11 cm Test dose: negative and 1.5% lidocaine with Epi 1:200 K  Assessment Sensory level: T10 Events: blood not aspirated, no cerebrospinal fluid, injection not painful, no injection resistance, no paresthesia and negative IV test  Additional Notes 1st attempt overall, but had trouble threading the epidural catheter.  The catheter would not thread further than 12cm despite numerous attempts.  Initially I thought maybe I was not in the epidural space, so I removed the epidural needle and tried again with a second loss of resistance.  Again the epidural catheter would not thread past 12cm.  Again, I removed the needle, went up a space, and again got a great loss of resistance.  The catheter would not thread for a third time, so I had the OB nurse get me a second kit.  The catheter from the new kit thread easily and the patient was getting comfortable when I was leaving the room.  It appears that the catheter from the first kit was faulty.  Pt. Evaluated and documentation done after procedure finished. Patient identified. Risks/Benefits/Options discussed with patient including but not limited to bleeding, infection, nerve damage, paralysis,  failed block, incomplete pain control, headache, blood pressure changes, nausea, vomiting, reactions to medication both or allergic, itching and postpartum back pain. Confirmed with bedside nurse the patient's most recent platelet count. Confirmed with patient that they are not currently taking any anticoagulation, have any bleeding history or any family history of bleeding disorders. Patient expressed understanding and wished to proceed. All questions were answered. Sterile technique was used throughout the entire procedure. Please see nursing notes for vital signs. Test dose was given through epidural catheter and negative prior to continuing to dose epidural or start infusion. Warning signs of high block given to the patient including shortness of breath, tingling/numbness in hands, complete motor block, or any concerning symptoms with instructions to call for help. Patient was given instructions on fall risk and not to get out of bed. All questions and concerns addressed with instructions to call with any issues or inadequate analgesia.    Patient tolerated the insertion well without immediate complications.Reason for block:procedure for pain

## 2022-08-30 NOTE — Progress Notes (Signed)
Labor Check  Subj:  Complaints: }has no unusual complaints  Obj:      Cervix: Dilation: 5.5 / Effacement (%): 60 / Station: -1  Baseline GEX:BMWUXLKG: 135 bpm, Variability: Good {> 6 bpm), Accelerations: Reactive, and Decelerations: Absent Contractions: 1-4 mins Overall assessment: reassuring   Current Vital Signs 24h Vital Sign Ranges  T   No data recorded  BP 122/81 BP  Min: 122/81  Max: 135/83  HR 96 Pulse  Avg: 96  Min: 96  Max: 96  RR 18 Resp  Avg: 18  Min: 18  Max: 18  SaO2     No data recorded       Medications SCHEDULED MEDICATIONS   misoprostol  50 mcg Oral Once   oxytocin 40 units in LR 1000 mL  333 mL Intravenous Once    MEDICATION INFUSIONS   lactated ringers     lactated ringers Stopped (08/30/22 1106)   oxytocin     oxytocin     oxytocin      PRN MEDICATIONS  acetaminophen, fentaNYL (SUBLIMAZE) injection, lactated ringers, lidocaine (PF), ondansetron, sodium citrate-citric acid, terbutaline, terbutaline    A/P: 32 y.o. G1P0 female at [redacted]w[redacted]d with Anemia and IVF pregnancy   1.  Labor: Active phase labor., Satisfactory labor progress., and Dysfunctional uterine contractions. pain controlled  IV pain meds 2.  [id number] assessment: Category I 3.  Group B Strep negative 4. Membranes ruptured, meconium moderate 5.  Pain: receiving treatment and level of pain (1-10, 10 severe), 6 6.  Recheck:Evaluated by digital exam. 7. Intervention: IV Pitocin augmentation, increase Pitocin rate, change maternal position, and anticipate vaginal delivery  Chari Manning [facility] 08/30/2022 4:55 PM

## 2022-08-30 NOTE — Progress Notes (Signed)
L&D Note    Subjective:  has no unusual complaints  Objective:   Vitals:   08/30/22 0929 08/30/22 1233  BP: 135/83 122/81  Pulse:  96  Resp: 18 18    Current Vital Signs 24h Vital Sign Ranges  T   No data recorded  BP 122/81 BP  Min: 122/81  Max: 135/83  HR 96 Pulse  Avg: 96  Min: 96  Max: 96  RR 18 Resp  Avg: 18  Min: 18  Max: 18  SaO2     No data recorded      Gen: alert, cooperative, no distress FHR: Baseline: 130 bpm, Variability: moderate, Accels: Present, Decels: none Toco: irregular, every 2-4 minutes SVE:    Medications SCHEDULED MEDICATIONS   misoprostol  50 mcg Oral Once   misoprostol  50 mcg Oral Once   oxytocin 40 units in LR 1000 mL  333 mL Intravenous Once    MEDICATION INFUSIONS   lactated ringers     lactated ringers Stopped (08/30/22 1106)   oxytocin     oxytocin      PRN MEDICATIONS  acetaminophen, fentaNYL (SUBLIMAZE) injection, lactated ringers, lidocaine (PF), ondansetron, sodium citrate-citric acid, terbutaline   Assessment & Plan:  32 y.o. G1P0 at [redacted]w[redacted]d admitted for IOL -Labor: Early latent labor. Cooks cervical cath remains in place x4 hours. -Fetal Well-being: Category I -GBS: negative -Membranes intact -Continue present management., Anticipate vaginal delivery., and Intervention: change maternal position -Analgesia: position changes , birth ball, and unmedicated labor support options    Yarisa Lynam Wonda Amis, CNM  08/30/2022 1:28 PM  [facility]

## 2022-08-31 ENCOUNTER — Other Ambulatory Visit: Payer: Self-pay

## 2022-08-31 ENCOUNTER — Encounter: Payer: Self-pay | Admitting: Obstetrics and Gynecology

## 2022-08-31 ENCOUNTER — Encounter
Admission: RE | Disposition: A | Discharge status: Home / Self Care | Payer: Self-pay | Source: Ambulatory Visit | Attending: Certified Nurse Midwife

## 2022-08-31 DIAGNOSIS — Z3A40 40 weeks gestation of pregnancy: Secondary | ICD-10-CM

## 2022-08-31 LAB — URINALYSIS, ROUTINE W REFLEX MICROSCOPIC
Bilirubin Urine: NEGATIVE
Glucose, UA: NEGATIVE mg/dL
Ketones, ur: NEGATIVE mg/dL
Nitrite: NEGATIVE
Protein, ur: NEGATIVE mg/dL
Specific Gravity, Urine: 1.005 (ref 1.005–1.030)
pH: 7 (ref 5.0–8.0)

## 2022-08-31 LAB — RPR: RPR Ser Ql: NONREACTIVE

## 2022-08-31 LAB — CREATININE, SERUM
Creatinine, Ser: 0.47 mg/dL (ref 0.44–1.00)
GFR, Estimated: >60 mL/min (ref >60)

## 2022-08-31 SURGERY — Surgical Case
Anesthesia: Epidural

## 2022-08-31 MED ORDER — SENNOSIDES-DOCUSATE SODIUM 8.6-50 MG PO TABS
2.0000 | ORAL_TABLET | ORAL | Status: DC
  Administered 2022-08-31 – 2022-09-02 (×3): 2 via ORAL
  Filled 2022-08-31 (×3): qty 2

## 2022-08-31 MED ORDER — PHENYLEPHRINE HCL-NACL 20-0.9 MG/250ML-% IV SOLN
INTRAVENOUS | Status: DC | PRN
  Administered 2022-08-31: 50 ug/min via INTRAVENOUS

## 2022-08-31 MED ORDER — FENTANYL CITRATE (PF) 100 MCG/2ML IJ SOLN: 25.0000 ug | INTRAMUSCULAR | Status: DC | PRN

## 2022-08-31 MED ORDER — ONDANSETRON HCL 4 MG/2ML IJ SOLN: 4.0000 mg | Freq: Three times a day (TID) | INTRAMUSCULAR | Status: DC | PRN

## 2022-08-31 MED ORDER — PRENATAL MULTIVITAMIN CH
1.0000 | ORAL_TABLET | Freq: Every day | ORAL | Status: DC
  Administered 2022-08-31 – 2022-09-01 (×2): 1 via ORAL
  Filled 2022-08-31 (×2): qty 1

## 2022-08-31 MED ORDER — MORPHINE SULFATE (PF) 0.5 MG/ML IJ SOLN
INTRAMUSCULAR | Status: AC
  Filled 2022-08-31: qty 10

## 2022-08-31 MED ORDER — DIPHENHYDRAMINE HCL 50 MG/ML IJ SOLN: 12.5000 mg | INTRAMUSCULAR | Status: DC | PRN

## 2022-08-31 MED ORDER — MENTHOL 3 MG MT LOZG: 1.0000 | LOZENGE | OROMUCOSAL | Status: DC | PRN

## 2022-08-31 MED ORDER — OXYCODONE HCL 5 MG PO TABS
5.0000 mg | ORAL_TABLET | Freq: Four times a day (QID) | ORAL | Status: DC | PRN
  Administered 2022-08-31 (×2): 5 mg via ORAL
  Filled 2022-08-31 (×2): qty 1

## 2022-08-31 MED ORDER — KETOROLAC TROMETHAMINE 30 MG/ML IJ SOLN
30.0000 mg | Freq: Four times a day (QID) | INTRAMUSCULAR | Status: DC | PRN
  Administered 2022-08-31: 30 mg via INTRAVENOUS
  Filled 2022-08-31: qty 1

## 2022-08-31 MED ORDER — CEFAZOLIN SODIUM-DEXTROSE 2-4 GM/100ML-% IV SOLN
INTRAVENOUS | Status: AC
  Filled 2022-08-31: qty 100

## 2022-08-31 MED ORDER — DIPHENHYDRAMINE HCL 25 MG PO CAPS: 25.0000 mg | ORAL_CAPSULE | Freq: Four times a day (QID) | ORAL | Status: DC | PRN

## 2022-08-31 MED ORDER — LIDOCAINE HCL (PF) 2 % IJ SOLN
INTRAMUSCULAR | Status: AC
  Filled 2022-08-31: qty 20

## 2022-08-31 MED ORDER — BUPIVACAINE 0.25 % ON-Q PUMP DUAL CATH 400 ML
400.0000 mL | INJECTION | Status: DC
  Filled 2022-08-31: qty 400

## 2022-08-31 MED ORDER — IBUPROFEN 600 MG PO TABS
600.0000 mg | ORAL_TABLET | Freq: Four times a day (QID) | ORAL | Status: DC
  Administered 2022-09-01 – 2022-09-02 (×6): 600 mg via ORAL
  Filled 2022-08-31 (×6): qty 1

## 2022-08-31 MED ORDER — OXYTOCIN-SODIUM CHLORIDE 30-0.9 UT/500ML-% IV SOLN
2.5000 [IU]/h | INTRAVENOUS | Status: AC
  Administered 2022-08-31: 2.5 [IU]/h via INTRAVENOUS
  Filled 2022-08-31: qty 500

## 2022-08-31 MED ORDER — CEFAZOLIN SODIUM-DEXTROSE 2-4 GM/100ML-% IV SOLN
2.0000 g | INTRAVENOUS | Status: AC
  Administered 2022-08-31: 2 g via INTRAVENOUS

## 2022-08-31 MED ORDER — SIMETHICONE 80 MG PO CHEW
80.0000 mg | CHEWABLE_TABLET | Freq: Three times a day (TID) | ORAL | Status: DC
  Administered 2022-08-31 – 2022-09-02 (×7): 80 mg via ORAL
  Filled 2022-08-31 (×7): qty 1

## 2022-08-31 MED ORDER — OXYCODONE-ACETAMINOPHEN 5-325 MG PO TABS
2.0000 | ORAL_TABLET | ORAL | Status: DC | PRN
  Administered 2022-09-02: 2 via ORAL
  Filled 2022-08-31: qty 2

## 2022-08-31 MED ORDER — FENTANYL CITRATE (PF) 100 MCG/2ML IJ SOLN
INTRAMUSCULAR | Status: AC
  Filled 2022-08-31: qty 2

## 2022-08-31 MED ORDER — FERROUS SULFATE 325 (65 FE) MG PO TABS
325.0000 mg | ORAL_TABLET | Freq: Two times a day (BID) | ORAL | Status: DC
  Administered 2022-08-31 – 2022-09-02 (×5): 325 mg via ORAL
  Filled 2022-08-31 (×5): qty 1

## 2022-08-31 MED ORDER — SOD CITRATE-CITRIC ACID 500-334 MG/5ML PO SOLN: 30.0000 mL | ORAL | Status: DC

## 2022-08-31 MED ORDER — OXYTOCIN-SODIUM CHLORIDE 30-0.9 UT/500ML-% IV SOLN
INTRAVENOUS | Status: AC
  Filled 2022-08-31: qty 500

## 2022-08-31 MED ORDER — NALOXONE HCL 4 MG/10ML IJ SOLN: 1.0000 ug/kg/h | INTRAVENOUS | Status: DC | PRN

## 2022-08-31 MED ORDER — MEPERIDINE HCL 25 MG/ML IJ SOLN: 6.2500 mg | INTRAMUSCULAR | Status: DC | PRN

## 2022-08-31 MED ORDER — NALOXONE HCL 0.4 MG/ML IJ SOLN: 0.4000 mg | INTRAMUSCULAR | Status: DC | PRN

## 2022-08-31 MED ORDER — PROMETHAZINE HCL 25 MG/ML IJ SOLN: 6.2500 mg | INTRAMUSCULAR | Status: DC | PRN

## 2022-08-31 MED ORDER — DIPHENHYDRAMINE HCL 25 MG PO CAPS: 25.0000 mg | ORAL_CAPSULE | ORAL | Status: DC | PRN

## 2022-08-31 MED ORDER — SODIUM CHLORIDE 0.9 % IV SOLN
2.0000 g | Freq: Four times a day (QID) | INTRAVENOUS | Status: DC
  Administered 2022-08-31 (×2): 2 g via INTRAVENOUS
  Filled 2022-08-31 (×5): qty 2000

## 2022-08-31 MED ORDER — LACTATED RINGERS IV SOLN: INTRAVENOUS | Status: DC

## 2022-08-31 MED ORDER — SOD CITRATE-CITRIC ACID 500-334 MG/5ML PO SOLN
ORAL | Status: AC
  Filled 2022-08-31: qty 15

## 2022-08-31 MED ORDER — SODIUM CHLORIDE 0.9% FLUSH: 3.0000 mL | INTRAVENOUS | Status: DC | PRN

## 2022-08-31 MED ORDER — SODIUM CHLORIDE 0.9 % IV SOLN
500.0000 mg | INTRAVENOUS | Status: AC
  Administered 2022-08-31: 500 mg via INTRAVENOUS

## 2022-08-31 MED ORDER — ONDANSETRON HCL 4 MG/2ML IJ SOLN
INTRAMUSCULAR | Status: AC
  Filled 2022-08-31: qty 2

## 2022-08-31 MED ORDER — LIDOCAINE HCL (PF) 2 % IJ SOLN
INTRAMUSCULAR | Status: DC | PRN
  Administered 2022-08-31 (×3): 5 mL via EPIDURAL

## 2022-08-31 MED ORDER — KETOROLAC TROMETHAMINE 30 MG/ML IJ SOLN: 30.0000 mg | Freq: Four times a day (QID) | INTRAMUSCULAR | Status: DC | PRN

## 2022-08-31 MED ORDER — MORPHINE SULFATE (PF) 0.5 MG/ML IJ SOLN
INTRAMUSCULAR | Status: DC | PRN
  Administered 2022-08-31: 3 mg via EPIDURAL

## 2022-08-31 MED ORDER — PHENYLEPHRINE HCL-NACL 20-0.9 MG/250ML-% IV SOLN
INTRAVENOUS | Status: AC
  Filled 2022-08-31: qty 250

## 2022-08-31 MED ORDER — COCONUT OIL OIL: 1.0000 | TOPICAL_OIL | Status: DC | PRN

## 2022-08-31 MED ORDER — DIBUCAINE (PERIANAL) 1 % EX OINT: 1.0000 | TOPICAL_OINTMENT | CUTANEOUS | Status: DC | PRN

## 2022-08-31 MED ORDER — FENTANYL CITRATE (PF) 100 MCG/2ML IJ SOLN
INTRAMUSCULAR | Status: DC | PRN
  Administered 2022-08-31: 100 ug via EPIDURAL

## 2022-08-31 MED ORDER — ONDANSETRON HCL 4 MG/2ML IJ SOLN
INTRAMUSCULAR | Status: DC | PRN
  Administered 2022-08-31: 4 mg via INTRAVENOUS

## 2022-08-31 MED ORDER — KETOROLAC TROMETHAMINE 30 MG/ML IJ SOLN: 30.0000 mg | Freq: Four times a day (QID) | INTRAMUSCULAR | Status: AC

## 2022-08-31 MED ORDER — SODIUM CHLORIDE 0.9 % IV SOLN
INTRAVENOUS | Status: AC
  Filled 2022-08-31: qty 5

## 2022-08-31 MED ORDER — WITCH HAZEL-GLYCERIN EX PADS: 1.0000 | MEDICATED_PAD | CUTANEOUS | Status: DC | PRN

## 2022-08-31 MED ORDER — ENOXAPARIN SODIUM 40 MG/0.4ML IJ SOSY
40.0000 mg | PREFILLED_SYRINGE | INTRAMUSCULAR | Status: DC
  Administered 2022-09-01 – 2022-09-02 (×2): 40 mg via SUBCUTANEOUS
  Filled 2022-08-31 (×2): qty 0.4

## 2022-08-31 MED ORDER — ACETAMINOPHEN 500 MG PO TABS
ORAL_TABLET | ORAL | Status: AC
  Administered 2022-08-31: 1000 mg
  Filled 2022-08-31: qty 2

## 2022-08-31 MED ORDER — KETOROLAC TROMETHAMINE 30 MG/ML IJ SOLN
30.0000 mg | Freq: Four times a day (QID) | INTRAMUSCULAR | Status: AC
  Administered 2022-08-31 (×2): 30 mg via INTRAVENOUS
  Filled 2022-08-31 (×2): qty 1

## 2022-08-31 MED ORDER — CHLORHEXIDINE GLUCONATE 0.12 % MT SOLN
OROMUCOSAL | Status: AC
  Filled 2022-08-31: qty 15

## 2022-08-31 MED ORDER — GENTAMICIN SULFATE 40 MG/ML IJ SOLN
5.0000 mg/kg | INTRAVENOUS | Status: DC
  Administered 2022-08-31: 480 mg via INTRAVENOUS
  Filled 2022-08-31: qty 12

## 2022-08-31 MED ORDER — OXYCODONE-ACETAMINOPHEN 5-325 MG PO TABS
1.0000 | ORAL_TABLET | ORAL | Status: DC | PRN
  Administered 2022-09-01 – 2022-09-02 (×3): 1 via ORAL
  Filled 2022-08-31 (×4): qty 1

## 2022-08-31 SURGICAL SUPPLY — 32 items
ADH SKN CLS APL DERMABOND .7 (GAUZE/BANDAGES/DRESSINGS) ×1
CATH KIT ON-Q SILVERSOAK 5 (CATHETERS) ×2 IMPLANT
CATH KIT ON-Q SILVERSOAK 5IN (CATHETERS) ×2 IMPLANT
DERMABOND ADVANCED .7 DNX12 (GAUZE/BANDAGES/DRESSINGS) ×1 IMPLANT
DRSG OPSITE POSTOP 4X10 (GAUZE/BANDAGES/DRESSINGS) ×1 IMPLANT
DRSG TELFA 3X8 NADH STRL (GAUZE/BANDAGES/DRESSINGS) ×1 IMPLANT
ELECT CAUTERY BLADE 6.4 (BLADE) ×1 IMPLANT
ELECT REM PT RETURN 9FT ADLT (ELECTROSURGICAL) ×1
ELECTRODE REM PT RTRN 9FT ADLT (ELECTROSURGICAL) ×1 IMPLANT
GAUZE SPONGE 4X4 12PLY STRL (GAUZE/BANDAGES/DRESSINGS) ×1 IMPLANT
GLOVE BIO SURGEON STRL SZ7 (GLOVE) ×1 IMPLANT
GLOVE INDICATOR 7.5 STRL GRN (GLOVE) ×1 IMPLANT
GOWN STRL REUS W/ TWL LRG LVL3 (GOWN DISPOSABLE) ×3 IMPLANT
GOWN STRL REUS W/TWL LRG LVL3 (GOWN DISPOSABLE) ×3
MANIFOLD NEPTUNE II (INSTRUMENTS) ×1 IMPLANT
MAT PREVALON FULL STRYKER (MISCELLANEOUS) ×1 IMPLANT
NS IRRIG 1000ML POUR BTL (IV SOLUTION) ×1 IMPLANT
PACK C SECTION AR (MISCELLANEOUS) ×1 IMPLANT
PAD OB MATERNITY 4.3X12.25 (PERSONAL CARE ITEMS) ×2 IMPLANT
PAD PREP OB/GYN DISP 24X41 (PERSONAL CARE ITEMS) ×1 IMPLANT
SCRUB CHG 4% DYNA-HEX 4OZ (MISCELLANEOUS) ×1 IMPLANT
STRIP CLOSURE SKIN 1/2X4 (GAUZE/BANDAGES/DRESSINGS) ×1 IMPLANT
SUT MNCRL 4-0 (SUTURE) ×1
SUT MNCRL 4-0 27XMFL (SUTURE) ×1
SUT PDS AB 1 TP1 96 (SUTURE) ×1 IMPLANT
SUT PLAIN GUT 0 (SUTURE) IMPLANT
SUT VIC AB 0 CTX 36 (SUTURE) ×2
SUT VIC AB 0 CTX36XBRD ANBCTRL (SUTURE) ×2 IMPLANT
SUTURE MNCRL 4-0 27XMF (SUTURE) ×1 IMPLANT
SWABSTK COMLB BENZOIN TINCTURE (MISCELLANEOUS) ×1 IMPLANT
TRAP FLUID SMOKE EVACUATOR (MISCELLANEOUS) ×1 IMPLANT
WATER STERILE IRR 500ML POUR (IV SOLUTION) ×1 IMPLANT

## 2022-08-31 NOTE — Lactation Note (Addendum)
This note was copied from a baby's chart. Lactation Consultation Note  Patient Name: Carolyn Salinas [id number] Date: 08/31/2022 Age:32 hours  Reason for consult: initial assessment, primipara, term, mother's request, breastfeeding assistance, RN request   Maternal Data  This is mom's 1st baby, C/S. Mom with history of IVF, pituitary deficiency due to rathke cleft cyst,obesity, anxiety, depression, anemia, prediabetes. Baby with unilateral renal agenesis. On initial visit mom requested assistance with breastfeeding. Mom holding baby who appears asleep .  Feeding  Mom's feeding choice on admission: Breastmilk.  Assisted parents with waking sleepy baby including unwrapping baby and  instructed dad on changing baby's diaper. Baby with wet diaper. Provided mom with tips and strategies to maximize position and latch techniques. Baby did latch and breastfed with audible swallows at mom's left breast in football hold.  LATCH Score=8   Mom required assistance with positioning baby at breast. Baby did need repeated attempts to latch but once latched spontaneously and rhythmically was sucking.  Interventions  Breastfeeding basics, positioning, support pillows, assistance with latch, breast compression, breast massage, hand expression, education  Discharge  Mom has person pump for home use.  Consult Status  Follow-up 08/30/22 Inpatient  Update provided to care nurse.  Carolyn Salinas 08/31/2022, 6:25 PM

## 2022-08-31 NOTE — Anesthesia Post-op Follow-up Note (Signed)
  Anesthesia Pain Follow-up Note  Patient: Carolyn Salinas  Day #: 1  Date of Follow-up: 08/31/2022 Time: 8:03 AM  Last Vitals:  Vitals:   08/31/22 0507 08/31/22 0645  BP:  121/66  Pulse: (!) 121 (!) 116  Resp:  18  Temp:  37.1 C  SpO2: 94% 97%    Level of Consciousness: alert  Pain: mild   Side Effects:None  Catheter Site Exam:clean, dry, no drainage  Anti-Coag Meds (From admission, onward)    Start     Dose/Rate Route Frequency Ordered Stop   09/01/22 0800  enoxaparin (LOVENOX) injection 40 mg        40 mg Subcutaneous Every 24 hours 08/31/22 0618          Plan: D/C from anesthesia care at surgeon's request  Jules Schick

## 2022-08-31 NOTE — Progress Notes (Addendum)
L&D Note    Subjective:  has no unusual complaints  Objective:   Vitals:   08/30/22 2153 08/30/22 2240 08/31/22 0013 08/31/22 0059  BP: (!) 107/52     Pulse:      Resp:      Temp:  99 F (37.2 C) 99.4 F (37.4 C) (!) 102.3 F (39.1 C)  TempSrc:  Oral Axillary Axillary  Weight:      Height:        Current Vital Signs 24h Vital Sign Ranges  T (!) 102.3 F (39.1 C) Temp  Avg: 100 F (37.8 C)  Min: 99 F (37.2 C)  Max: 102.3 F (39.1 C)  BP (!) 107/52 BP  Min: 101/58  Max: 147/85  HR (!) 113 Pulse  Avg: 104.5  Min: 96  Max: 113  RR 16 Resp  Avg: 17.3  Min: 16  Max: 18  SaO2     No data recorded      Gen: alert, cooperative, no distress FHR: Baseline: 185 bpm, Variability: moderate, Accels: Present, Decels: prolonged deceleration x 4 mins Toco: regular, every 1-2 minutes SVE: Dilation: 5.5 Effacement (%): 80 Station: -1 Exam by:: Rubye Oaks CNM  Medications SCHEDULED MEDICATIONS   misoprostol  50 mcg Oral Once   oxytocin 40 units in LR 1000 mL  333 mL Intravenous Once    MEDICATION INFUSIONS   ampicillin (OMNIPEN) IV 2 g (08/31/22 0123)   fentaNYL 2 mcg/mL w/bupivacaine 0.125% in NS 250 mL     gentamicin     lactated ringers     lactated ringers     lactated ringers Stopped (08/30/22 1106)   oxytocin     oxytocin     oxytocin 6 milli-units/min (08/31/22 0048)    PRN MEDICATIONS  acetaminophen, diphenhydrAMINE, ePHEDrine, ePHEDrine, fentaNYL (SUBLIMAZE) injection, fentaNYL 2 mcg/mL w/bupivacaine 0.125% in NS 250 mL, lactated ringers, lidocaine (PF), ondansetron, phenylephrine, phenylephrine, sodium citrate-citric acid, terbutaline, terbutaline   Assessment & Plan:  32 y.o. G1P0 at [redacted]w[redacted]d admitted for IVF -Labor: Prolonged latent labor., Arrest of progress - first stage labor., Fetal intolerance of labor., and Suspect fetal compromise. -Fetal Well-being: Category II -GBS: negative -Membranes ruptured, meconium moderate -Intervention: IV fluid bolus, change  maternal position, reduce stimulation (IV Pitocin), D/C stimulation, place IUPC, plan Cesarean delivery, and FSE placed, suspected chorio will start Amp/Gent., IV fluid bolus and oral Tylenol given. -Analgesia: regional anesthesia -Patient/spouse agrees to proceed with C-Section at this time.  Dr Jean Rosenthal notified and aware of labor events -Dr Jean Rosenthal en route to hospital for c-section   Noel Rodier Wonda Amis, CNM  08/31/2022 1:32 AM  [facility]

## 2022-08-31 NOTE — Transfer of Care (Signed)
Immediate Anesthesia Transfer of Care Note  Patient: Carolyn Salinas  Procedure(s) Performed: CESAREAN SECTION  Patient Location: PACU and Mother/Baby  Anesthesia Type:Epidural  Level of Consciousness: awake, alert , and oriented  Airway & Oxygen Therapy: Patient Spontanous Breathing  Post-op Assessment: Report given to RN and Post -op Vital signs reviewed and stable  Post vital signs: Reviewed and stable  Last Vitals:  Vitals Value Taken Time  BP 117/78 08/31/22 0406  Temp    Pulse 117 08/31/22 0405  Resp 18 08/31/22 0406  SpO2 96 % 08/31/22 0406  Vitals shown include unfiled device data.  Last Pain:  Vitals:   08/31/22 0059  TempSrc: Axillary  PainSc:          Complications: No notable events documented.

## 2022-08-31 NOTE — Op Note (Signed)
Cesarean Section Operative Note    Patient Name: Carolyn Salinas  Date of Birth: Oct 08, 1990  MRN: [id number]  Date of Surgery: 08/31/2022   Pre-operative Diagnosis:  1) Arrest of dilation 2) Chorioamnionitis 3) intrauterine pregnancy at [redacted]w[redacted]d   Post-operative Diagnosis:  1) Arrest of dilation 2) Chorioamnionitis 3) intrauterine pregnancy at [redacted]w[redacted]d    Procedure: Primary Low Transverse Cesarean Delivery via Pfannenstiel incision with double layer uterine closure  Surgeon: Surgeons and Role:    Conard Novak, MD - Primary   Assistants: Chari Manning, CNM; No other capable assistant available, in surgery requiring high level assistant.  Anesthesia: epidural   Findings:  1) normal appearing gravid uterus, fallopian tubes, and ovaries 2) viable female infant with APGARs 8 and 9, weight 3,770 grams   Quantified Blood Loss: 730  Total IV Fluids: 400 ml   Specimens: None  Complications: no complications  Disposition: PACU - hemodynamically stable.   Maternal Condition: stable   Baby condition / location:  Couplet care / Skin to Skin  Procedure Details:  The patient was seen in the Holding Room. The risks, benefits, complications, treatment options, and expected outcomes were discussed with the patient. The patient concurred with the proposed plan, giving informed consent. identified as Carolyn Salinas and the procedure verified as C-Section Delivery. A Time Out was held and the above information confirmed.   After induction of anesthesia, the patient was draped and prepped in the usual sterile manner. A Pfannenstiel incision was made and carried down through the subcutaneous tissue to the fascia. Fascial incision was made and extended transversely. The fascia was separated from the underlying rectus tissue superiorly and inferiorly. The peritoneum was identified and entered. Peritoneal incision was extended longitudinally. The bladder flap was not bluntly or sharply  freed from the lower uterine segment. A low transverse uterine incision was made and the hysterotomy was extended with cranial-caudal tension. Delivered from cephalic presentation was a 3,770 gram Living newborn infant(s) or Female with Apgar scores of 8 at one minute and 9 at five minutes. Cord ph was not sent the umbilical cord was clamped and cut cord blood was not obtained for evaluation. The placenta was removed Intact and appeared normal. The uterine outline, tubes and ovaries appeared normal. The uterine incision was closed with running locked sutures of 0 Vicryl.  A second layer of the same suture was thrown in an imbricating fashion.  Hemostasis was assured.  The uterus was returned to the abdomen and the paracolic gutters were cleared of all clots and debris.  The rectus muscles were inspected and found to be hemostatic.  The fascia was then reapproximated with running sutures of 1-0 PDS, looped. The subcuticular closure was performed using 4-0 monocryl. The skin closure was reinforced using surgical skin glue.  The surgical assistant performed tissue retraction, assistance with suturing, and fundal pressure.  Instrument, sponge, and needle counts were correct prior the abdominal closure and were correct at the conclusion of the case.  The patient received Ancef 2 gram IV prior to skin incision (within 30 minutes). For VTE prophylaxis she was wearing SCDs throughout the case.  The assistant surgeon was an CNM due to lack of availability of another Sales promotion account executive.    Signed: Conard Novak, MD 08/31/2022 3:49 AM

## 2022-08-31 NOTE — Progress Notes (Signed)
The patient has developed apparent chorioamnionitis with fetal tachycardia to the 180s currently (since about 0100).  There is a maternal tachycardia, as well with her fever reaching 102.21F.   She has made no cervical change since about 445pm and is about 5.5 cm with a thick cervix. We discussed that if she were making progress toward delivery, we could consider trying to resolve her fever and the fetal tachycardia and try to gently get her delivered vaginally. However, we discussed, the general rule is to be moving in the direction of delivery. It appears she has an arrest disorder, though the IUPC has only recently been placed.  With the pitocin off and when the IUPC was placed, she did appear to be having about 5 ctx q 10 min with an average contraction strength of about 30-50.  I estimate her MVUs to be about 180+ and likely higher with the pitocin active.   After discussing the above, a mutual decision was made to move forward with a primary c section to affect delivery. She has had ampicillin and is receiving gentamicin now.  She agrees to proceed and I personally counseled her regarding the risks/benefits of the c-section. Will proceed ASAP.  Thomasene Mohair, MD, [facility] 08/31/2022 2:33 AM

## 2022-08-31 NOTE — Anesthesia Postprocedure Evaluation (Signed)
Anesthesia Post Note  Patient: Carolyn Salinas  Procedure(s) Performed: CESAREAN SECTION  Patient location during evaluation: Mother Baby Anesthesia Type: Epidural Level of consciousness: awake and alert Pain management: pain level controlled Vital Signs Assessment: post-procedure vital signs reviewed and stable Respiratory status: spontaneous breathing, nonlabored ventilation and respiratory function stable Cardiovascular status: stable Postop Assessment: no headache, no backache, epidural receding, patient able to bend at knees and no apparent nausea or vomiting Anesthetic complications: no   No notable events documented.   Last Vitals:  Vitals:   08/31/22 0507 08/31/22 0645  BP:  121/66  Pulse: (!) 121 (!) 116  Resp:  18  Temp:  37.1 C  SpO2: 94% 97%    Last Pain:  Vitals:   08/31/22 0059  TempSrc: Axillary  PainSc:                  Jules Schick

## 2022-08-31 NOTE — Discharge Summary (Signed)
Obstetrical Discharge Summary  Patient Name: Carolyn Salinas DOB: 07/29/1990 MRN: [id number]  Date of admission: 08/30/2022 Delivery date:08/31/2022 Delivering provider: Thomasene Mohair D Date of discharge: 09/02/2022  Primary OB: [facility] ONG:[id number] last menstrual period was 11/19/2021 (approximate). 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 Obesity Anxiety and depression Fetal macrosomia Anemia Prediabetes Unilateral renal agenesis  Left fetal renal pelviectasis= 0.88 mm  Admitting diagnosis: Encounter for planned induction of labor [Z34.90] Intrauterine pregnancy: [redacted]w[redacted]d     Secondary diagnosis:  Principal Problem:   Encounter for planned induction of labor Active Problems:   Encounter for supervision of high-risk pregnancy with history of infertility in third trimester   [redacted] weeks gestation of pregnancy  Additional problems: chorioamnionitis, fetal intolerance of labor, primary cesarean section  Discharge diagnosis: Term Pregnancy Delivered                                              Post partum procedures: IV Venofer Augmentation: Pitocin, Cytotec, and IP Foley Complications: Intrauterine Inflammation or infection (Chorioamniotis)  Intrapartum complications/course: The patient has developed apparent chorioamnionitis with fetal tachycardia to the 180s currently (since about 0100).  There is a maternal tachycardia, as well with her fever reaching 102.9F.   She has made no cervical change since about 445pm and is about 5.5 cm with a thick cervix. We discussed that if she were making progress toward delivery, we could consider trying to resolve her fever and the fetal tachycardia and try to gently get her delivered vaginally. However, we discussed, the general rule is to be moving in the direction of delivery. It appears she has an arrest disorder,  though the IUPC has only recently been placed.  With the pitocin off and when the IUPC was placed, she did appear to be having about 5 ctx q 10 min with an average contraction strength of about 30-50.  I estimate her MVUs to be about 180+ and likely higher with the pitocin active.   After discussing the above, a mutual decision was made to move forward with a primary c section to affect delivery. She has had ampicillin and is receiving gentamicin now.  She agrees to proceed and I personally counseled her regarding the risks/benefits of the c-section. Will proceed ASAP. Delivery Type: primary cesarean section, low transverse incision Anesthesia: epidural anesthesia Placenta: manual removal To Pathology: No  Laceration: n/a Episiotomy: none Newborn Data: Live born female  Birth Weight: 8 lb 5 oz (3770 g) APGAR: 8, 9  Newborn Delivery   Birth date/time: 08/31/2022 03:07:00 Delivery type: C-Section, Low Transverse Trial of labor: Yes C-section categorization: Primary      Edinburgh:     08/31/2022    8:00 AM  Inocente Salles Postnatal Depression Scale Screening Tool  I have been able to laugh and see the funny side of things. 0  I have looked forward with enjoyment to things. 0  I have blamed myself unnecessarily when things went wrong. 0  I have been anxious or worried for no good reason. 2  I have felt scared or panicky for no good reason. 1  Things have been getting on top of me. 1  I have been so unhappy that I have had difficulty sleeping. 0  I have felt sad or miserable. 0  I have been so unhappy that I have been crying. 0  The thought of harming myself has occurred to me. 0  Edinburgh Postnatal Depression Scale Total 4     Hospital course: Induction of Labor With Cesarean Section   32 y.o. yo G1P1001 at [redacted]w[redacted]d was admitted to the hospital 08/30/2022 for induction of labor. Patient had a labor course significant for IOL for IVF with arrest of first stage and Chorioamnionitis . The  patient went for cesarean section due to Arrest of Dilation, Non-Reassuring FHR, and Chorio . Delivery details are as follows: Membrane Rupture Time/Date: 3:25 PM,08/30/2022  Delivery Method:C-Section, Low Transverse Details of operation can be found in separate operative Note.  Patient had a postpartum course complicated by acute blood loss anemia. She received IV Venofer prior to discharge. She is ambulating, tolerating a regular diet, passing flatus, and urinating well.  Patient is discharged home in stable condition on 09/02/22.      Newborn Data: Birth date:08/31/2022 Birth time:3:07 AM Gender:Female Living status:Living Apgars:8 ,9  Weight:3770 g                              Transfusion:Yes - IV Venofer  Discharge Physical Exam:   BP 118/86 (BP Location: Left Arm)   Pulse 93   Temp 98.5 F (36.9 C) (Oral)   Resp 18   Ht 5\' 3"  (1.6 m)   Wt 96.2 kg   LMP 11/19/2021 (Approximate)   SpO2 97%   Breastfeeding Unknown   BMI 37.55 kg/m   General: alert, cooperative, and no distress Pulm:  nl effort ABD: s/nd/nt, fundus firm and below the umbilicus Lochia: appropriate Uterine Fundus: firm Perineum: minimal edema/intact Incision: c/d/I, covered with occlusive OP site dressing   DVT Evaluation: LE non-ttp, no evidence of DVT on exam.  Hemoglobin  Date Value Ref Range Status  09/01/2022 8.9 (L) 12.0 - 15.0 g/dL Final   HCT  Date Value Ref Range Status  09/01/2022 27.0 (L) 36.0 - 46.0 % Final    Risk assessment for postpartum VTE and prophylactic treatment: Very high risk factors: None High risk factors: Unscheduled cesarean after labor  Moderate risk factors: Cesarean delivery  and BMI 30-40 kg/m2  Postpartum VTE prophylaxis with LMWH ordered  Disposition: stable, discharge to home. Baby Feeding: breast feeding Baby Disposition: home with mom  Rh Immune globulin indicated: No MMR vaccine given: was not indicated Varivax vaccine given: was not indicated Flu vaccine  given in AP setting: No Tdap vaccine given in AP setting: Yes   Discharge home in stable condition Infant Feeding: Breast Infant Disposition:home with mother Discharge instruction: per After Visit Summary and Postpartum booklet. Activity: Advance as tolerated. Pelvic rest for 6 weeks.  Diet: routine diet Anticipated Birth Control:  none Postpartum Appointment:6 weeks Additional Postpartum F/U: Incision check 1 week Future Appointments:No future appointments. Follow up Visit:  Follow-up Information     Conard Novak, MD. Schedule an appointment as soon as possible for a visit in 1 week(s).   Specialty: Obstetrics and Gynecology Why: For incision check, Post-op follow up Contact information: [address] [place] [address] [phone]                  Discharge Medications: Allergies as of 09/02/2022       Reactions   Latex Hives        Medication List     STOP taking these medications  aspirin EC 81 MG tablet       TAKE these medications    ferrous sulfate 325 (65 FE) MG tablet Take 1 tablet (325 mg total) by mouth 2 (two) times daily with a meal.   ibuprofen 600 MG tablet Commonly known as: ADVIL Take 1 tablet (600 mg total) by mouth every 6 (six) hours.   oxyCODONE 5 MG immediate release tablet Commonly known as: Oxy IR/ROXICODONE Take 1 tablet (5 mg total) by mouth every 6 (six) hours as needed for up to 7 days for severe pain.   prenatal multivitamin Tabs tablet Take 1 tablet by mouth daily at 12 noon.   senna-docusate 8.6-50 MG tablet Commonly known as: Senokot-S Take 2 tablets by mouth at bedtime as needed for mild constipation.   simethicone 80 MG chewable tablet Commonly known as: MYLICON Chew 1 tablet (80 mg total) by mouth 4 (four) times daily as needed for flatulence.         Follow-up Information     Conard Novak, MD. Schedule an appointment as soon as possible for a visit in 1 week(s).    Specialty: Obstetrics and Gynecology Why: For incision check, Post-op follow up Contact information: [address] [place] [address] [phone]                 Signed:  Janyce Llanos, [employer] 09/02/2022

## 2022-09-01 ENCOUNTER — Encounter: Payer: Self-pay | Admitting: Obstetrics and Gynecology

## 2022-09-01 MED ORDER — SODIUM CHLORIDE 0.9 % IV SOLN: INTRAVENOUS | Status: DC | PRN

## 2022-09-01 MED ORDER — SODIUM CHLORIDE 0.9 % IV SOLN
300.0000 mg | Freq: Once | INTRAVENOUS | Status: AC
  Administered 2022-09-01: 300 mg via INTRAVENOUS
  Filled 2022-09-01: qty 100

## 2022-09-01 NOTE — Progress Notes (Signed)
Postop Day  1  Subjective: 32 y.o. G1P1001 postpartum day #1 status post primary cesarean section. She is ambulating, is tolerating po, is voiding spontaneously.  Her pain is well controlled on PO pain medications. Her lochia is less than menses.  Objective: BP 115/73   Pulse 93   Temp 98.2 F (36.8 C) (Oral)   Resp 18   Ht 5\' 3"  (1.6 m)   Wt 96.2 kg   LMP 11/19/2021 (Approximate)   SpO2 97%   Breastfeeding Unknown   BMI 37.55 kg/m    Physical Exam:  General: alert and cooperative Breasts: soft/nontender Pulm: nl effort Abdomen: soft, non-tender, active bowel sounds Uterine Fundus: firm Incision: healing well Perineum: minimal edema,  n/a Lochia: appropriate DVT Evaluation: No evidence of DVT seen on physical exam.  Recent Labs    08/30/22 1027 09/01/22 0656  HGB 11.0* 8.9*  HCT 32.3* 27.0*  WBC 10.8* 19.4*  PLT 280 257    Assessment/Plan: 31 y.o. G1P1001 postpartum day # 1  1. Continue routine postpartum care  2. Infant feeding status: breast feeding -Lactation consult PRN for breastfeeding   3. Contraception plan: no method  4. Acute blood loss anemia - clinically significant.  -Intervention: IV iron transfusion with venofer ordered   5. Immunization status:   all immunizations up to date   Disposition: continue inpatient postpartum care    LOS: 2 days   Cyril Mourning, CNM 09/01/2022, 8:30 AM   ----- Haroldine Laws Certified Nurse Midwife [facility]

## 2022-09-01 NOTE — Lactation Note (Signed)
This note was copied from a baby's chart. Lactation Consultation Note  Patient Name: Girl Rua Lucus [id number] Date: 09/01/2022 Age:32 hours Reason for consult: Follow-up assessment;Primapara;Term   Maternal Data  This is mom's 1st baby, C/S. Mom with history of IVF, pituitary deficiency due to rathke cleft cyst,obesity, anxiety, depression, anemia, prediabetes. Baby with unilateral renal agenesis.   Today on follow-up mom reports baby has been latching and breastfeeding well. Mom reports feeling more confident as she has been able to latch and breastfeed baby. Mom reports per her endocrinologist mom's pituitary cyst would not cause any hormonal disturbance that would interfere with breastfeeding.  Has patient been taught Hand Expression?: Yes Does the patient have breastfeeding experience prior to this delivery?: No  Feeding Mother's Current Feeding Choice: Breast Milk  LATCH Score =10   Lactation Tools Discussed/Used  Manual Harmony pump provided: reviewed set-up, cleaning and milk storage guidelines. Mom would like a manual pump. Discussed use when mom's milk transitions in if baby has difficulty with latch to hand express and/or use manual pump to elicit her let-down , soften areola, and erect the nipple to encourage a deep latch.  Interventions Interventions: Breast feeding basics reviewed;Education  Discharge Discharge Education: Engorgement and breast care;Warning signs for feeding baby;Outpatient recommendation Pump: Personal;Manual  Consult Status Consult Status: Follow-up Date: 09/02/22 Follow-up type: In-patient  Update provided to care nurse.  Fuller Song 09/01/2022, 12:00 PM

## 2022-09-02 ENCOUNTER — Ambulatory Visit: Payer: Self-pay

## 2022-09-02 MED ORDER — FERROUS SULFATE 325 (65 FE) MG PO TABS: 325.0000 mg | ORAL_TABLET | Freq: Two times a day (BID) | ORAL | 3 refills | Status: AC

## 2022-09-02 MED ORDER — IBUPROFEN 600 MG PO TABS: 600.0000 mg | ORAL_TABLET | Freq: Four times a day (QID) | ORAL | 0 refills | Status: AC

## 2022-09-02 MED ORDER — SENNOSIDES-DOCUSATE SODIUM 8.6-50 MG PO TABS: 2.0000 | ORAL_TABLET | Freq: Every evening | ORAL | 0 refills | Status: AC | PRN

## 2022-09-02 MED ORDER — SIMETHICONE 80 MG PO CHEW: 80.0000 mg | CHEWABLE_TABLET | Freq: Four times a day (QID) | ORAL | 0 refills | Status: AC | PRN

## 2022-09-02 MED ORDER — OXYCODONE HCL 5 MG PO TABS: 5.0000 mg | ORAL_TABLET | Freq: Four times a day (QID) | ORAL | 0 refills | Status: AC | PRN

## 2022-09-02 NOTE — Progress Notes (Signed)
D/C home with partner to self care/verb understanding of D/C instructions

## 2022-09-02 NOTE — Lactation Note (Signed)
This note was copied from a baby's chart. Lactation Consultation Note  Patient Name: Carolyn Salinas [id number] Date: 09/02/2022 Age:32 hours Reason for consult: Follow-up assessment;Primapara;Maternal discharge   Maternal Data Has patient been taught Hand Expression?: Yes Does the patient have breastfeeding experience prior to this delivery?: No  Feeding Mother's Current Feeding Choice: Breast Milk Mom states that baby has been latching and breastfeeding well, has not breastfed recently, attempted 30 min ago, but baby too sleepy, is being discharged so plans on breastfeeding when they get home, baby is asleep currently, no cues noted, mom has questions about cluster feeding and gassiness, all questions answered  LATCH Score Latch:  (I did not observe a feeding)                  Lactation Tools Discussed/Used LC name updated on white board   Interventions Interventions: Education  Discharge Pump: Personal WIC Program: No  Consult Status Consult Status: PRN Date: 09/02/22 Follow-up type: In-patient    Dyann Kief 09/02/2022, 2:51 PM

## 2022-09-04 ENCOUNTER — Telehealth: Payer: Self-pay

## 2022-09-04 NOTE — Telephone Encounter (Signed)
[facility]- Discharge Call Backs- Spoke to patient on the phone about the following below. 1-Do you have any questions or concerns about yourself as you heal?No 2-Any concerns or questions about your baby?No Is your baby eating, peeing,pooping well?Yes 3-Reviewed ABC's of safe sleep. 4-How was your stay at the hospital?Great 5- Did our team work together to care for you?Yes You should be receiving a survey in the mail soon.   We would really appreciate it if you could fill that out for [place] and return it in the mail.  We value the feedback to make improvements and continue the great work we do.   If you have any questions please feel free to call me back at [phone]
